# Patient Record
Sex: Male | Born: 1995 | Race: White | Hispanic: Yes | Marital: Single
Health system: Southern US, Community
[De-identification: ages and names within clinical notes are randomized; demographics above are authoritative.]

---

## 2021-07-04 DIAGNOSIS — R002 Palpitations: Secondary | ICD-10-CM | POA: Insufficient documentation

## 2021-07-04 DIAGNOSIS — R0789 Other chest pain: Secondary | ICD-10-CM | POA: Diagnosis present

## 2021-07-05 ENCOUNTER — Encounter (HOSPITAL_COMMUNITY): Payer: Self-pay

## 2021-07-05 ENCOUNTER — Emergency Department (HOSPITAL_COMMUNITY): Payer: No Typology Code available for payment source

## 2021-07-05 ENCOUNTER — Other Ambulatory Visit: Payer: Self-pay

## 2021-07-05 ENCOUNTER — Emergency Department (HOSPITAL_COMMUNITY)
Admission: EM | Admit: 2021-07-05 | Discharge: 2021-07-05 | Disposition: A | Discharge status: Home / Self Care | Payer: No Typology Code available for payment source | Attending: Emergency Medicine | Admitting: Emergency Medicine

## 2021-07-05 DIAGNOSIS — R0789 Other chest pain: Secondary | ICD-10-CM

## 2021-07-05 LAB — BASIC METABOLIC PANEL
Anion gap: 14 (ref 5–15)
BUN: 22 mg/dL — ABNORMAL HIGH (ref 6–20)
CO2: 20 mmol/L — ABNORMAL LOW (ref 22–32)
Calcium: 9.5 mg/dL (ref 8.9–10.3)
Chloride: 106 mmol/L (ref 98–111)
Creatinine, Ser: 1.41 mg/dL — ABNORMAL HIGH (ref 0.61–1.24)
GFR, Estimated: >60 mL/min (ref >60)
Glucose, Bld: 161 mg/dL — ABNORMAL HIGH (ref 70–99)
Potassium: 3.1 mmol/L — ABNORMAL LOW (ref 3.5–5.1)
Sodium: 140 mmol/L (ref 135–145)

## 2021-07-05 LAB — CBC
HCT: 43.4 % (ref 39.0–52.0)
Hemoglobin: 15.7 g/dL (ref 13.0–17.0)
MCH: 32 pg (ref 26.0–34.0)
MCHC: 36.2 g/dL — ABNORMAL HIGH (ref 30.0–36.0)
MCV: 88.4 fL (ref 80.0–100.0)
Platelets: 296 10*3/uL (ref 150–400)
RBC: 4.91 MIL/uL (ref 4.22–5.81)
RDW: 11.8 % (ref 11.5–15.5)
WBC: 6.8 10*3/uL (ref 4.0–10.5)
nRBC: 0 % (ref 0.0–0.2)

## 2021-07-05 LAB — TROPONIN I (HIGH SENSITIVITY)
Troponin I (High Sensitivity): 2 ng/L (ref <18)
Troponin I (High Sensitivity): <2 ng/L (ref <18)

## 2021-07-05 NOTE — ED Notes (Signed)
Save blue tube in main lab °

## 2021-07-05 NOTE — ED Triage Notes (Signed)
Pt reports with chest pain since 1145 pm. Pt reports taking a delta 8 gummy but states that he has been doing them for 2 years.

## 2021-07-05 NOTE — ED Notes (Signed)
EDP at bedside  

## 2021-07-05 NOTE — ED Notes (Signed)
Patient verbalizes understanding of discharge instructions. Opportunity for questioning and answers were provided. Armband removed by staff, pt discharged from ED to home with significant other via POV

## 2021-07-05 NOTE — Discharge Instructions (Signed)
Drink plenty of fluids and get plenty of rest.  Follow-up with primary doctor in the next 1 to 2 weeks, and return to the ER if symptoms significantly worsen or change.

## 2021-07-05 NOTE — ED Provider Notes (Signed)
[facility] DEPT Provider Note   CSN: [id number] Arrival date & time: 07/04/21  2355     History  Chief Complaint  Patient presents with   Chest Pain    Jorge Alvarado is a 26 y.o. male.  Patient is a 26 year old male with no significant past medical history presenting today with complaints of chest discomfort.  This started acutely at approximately 1145 this evening.  Patient was showering at the time.  He felt fine throughout the day until the onset of these symptoms.  Patient tells me that him and his significant other were recently in [place] and just returned home yesterday.  They did travel for an extended period of time, but he denies any leg pain.  Symptoms have since resolved and he feels better.  During my exam, patient did not mention taking a delta 8 gummy as was reported in the nurses note.  The history is provided by the patient.      Home Medications Prior to Admission medications   Not on File      Allergies    Patient has no allergy information on record.    Review of Systems   Review of Systems  Cardiovascular:  Positive for chest pain.  All other systems reviewed and are negative.  Physical Exam Updated Vital Signs BP 133/77   Pulse (!) 116   Resp (!) 21   Ht 5\' 8"  (1.727 m)   Wt 77.1 kg   SpO2 100%   BMI 25.85 kg/m  Physical Exam Vitals and nursing note reviewed.  Constitutional:      General: He is not in acute distress.    Appearance: He is well-developed. He is not diaphoretic.  HENT:     Head: Normocephalic and atraumatic.  Cardiovascular:     Rate and Rhythm: Normal rate and regular rhythm.     Heart sounds: No murmur heard.   No friction rub.  Pulmonary:     Effort: Pulmonary effort is normal. No respiratory distress.     Breath sounds: Normal breath sounds. No wheezing or rales.  Abdominal:     General: Bowel sounds are normal. There is no distension.     Palpations: Abdomen is soft.      Tenderness: There is no abdominal tenderness.  Musculoskeletal:        General: Normal range of motion.     Cervical back: Normal range of motion and neck supple.     Right lower leg: No tenderness. No edema.     Left lower leg: No tenderness. No edema.  Skin:    General: Skin is warm and dry.  Neurological:     Mental Status: He is alert and oriented to person, place, and time.     Coordination: Coordination normal.    ED Results / Procedures / Treatments   Labs (all labs ordered are listed, but only abnormal results are displayed) Labs Reviewed  BASIC METABOLIC PANEL - Abnormal; Notable for the following components:      Result Value   Potassium 3.1 (*)    CO2 20 (*)    Glucose, Bld 161 (*)    BUN 22 (*)    Creatinine, Ser 1.41 (*)    All other components within normal limits  CBC - Abnormal; Notable for the following components:   MCHC 36.2 (*)    All other components within normal limits  TROPONIN I (HIGH SENSITIVITY)  TROPONIN I (HIGH SENSITIVITY)    EKG EKG  Interpretation  Date/Time:  Friday Jul 05 2021 00:49:37 EDT Ventricular Rate:  107 PR Interval:  180 QRS Duration: 97 QT Interval:  336 QTC Calculation: 449 R Axis:   90 Text Interpretation: Sinus tachycardia Borderline right axis deviation Baseline wander in lead(s) V3 Confirmed by Veryl Speak [phone]) on 07/05/2021 12:53:26 AM  Radiology DG Chest Port 1 View  Result Date: 07/05/2021 CLINICAL DATA:  Chest pain EXAM: PORTABLE CHEST 1 VIEW COMPARISON:  None Available. FINDINGS: The heart size and mediastinal contours are within normal limits. Both lungs are clear. The visualized skeletal structures are unremarkable. IMPRESSION: No active disease. Electronically Signed   By: Inez Catalina M.D.   On: 07/05/2021 00:27    Procedures Procedures    Medications Ordered in ED Medications - No data to display  ED Course/ Medical Decision Making/ A&P  Patient presenting here with complaints of chest discomfort  and palpitations.  This started this evening while he was in the shower.  Symptoms lasted approximately 2 hours and seem to have resolved.  His work-up here is unremarkable including EKG, laboratory studies, and troponin x2.  Patient did admit to the nurse that he took a delta 8 gummy earlier in the evening and I suspect that this may have been a side effect of this.  I feel as though patient can safely be discharged.  He has been ambulated in the ED and appears well.  To return as needed for any problems.  Final Clinical Impression(s) / ED Diagnoses Final diagnoses:  None    Rx / DC Orders ED Discharge Orders     None         Veryl Speak, MD 07/05/21 [phone]

## 2021-07-05 NOTE — ED Notes (Signed)
Unable to collect temp at this time 

## 2023-04-24 IMAGING — DX DG CHEST 1V PORT
1 series · 1 of 1 positions shown · non-contrast
Comparison: None Available.

CLINICAL DATA: Chest pain

EXAM:
PORTABLE CHEST 1 VIEW

[chest ap]
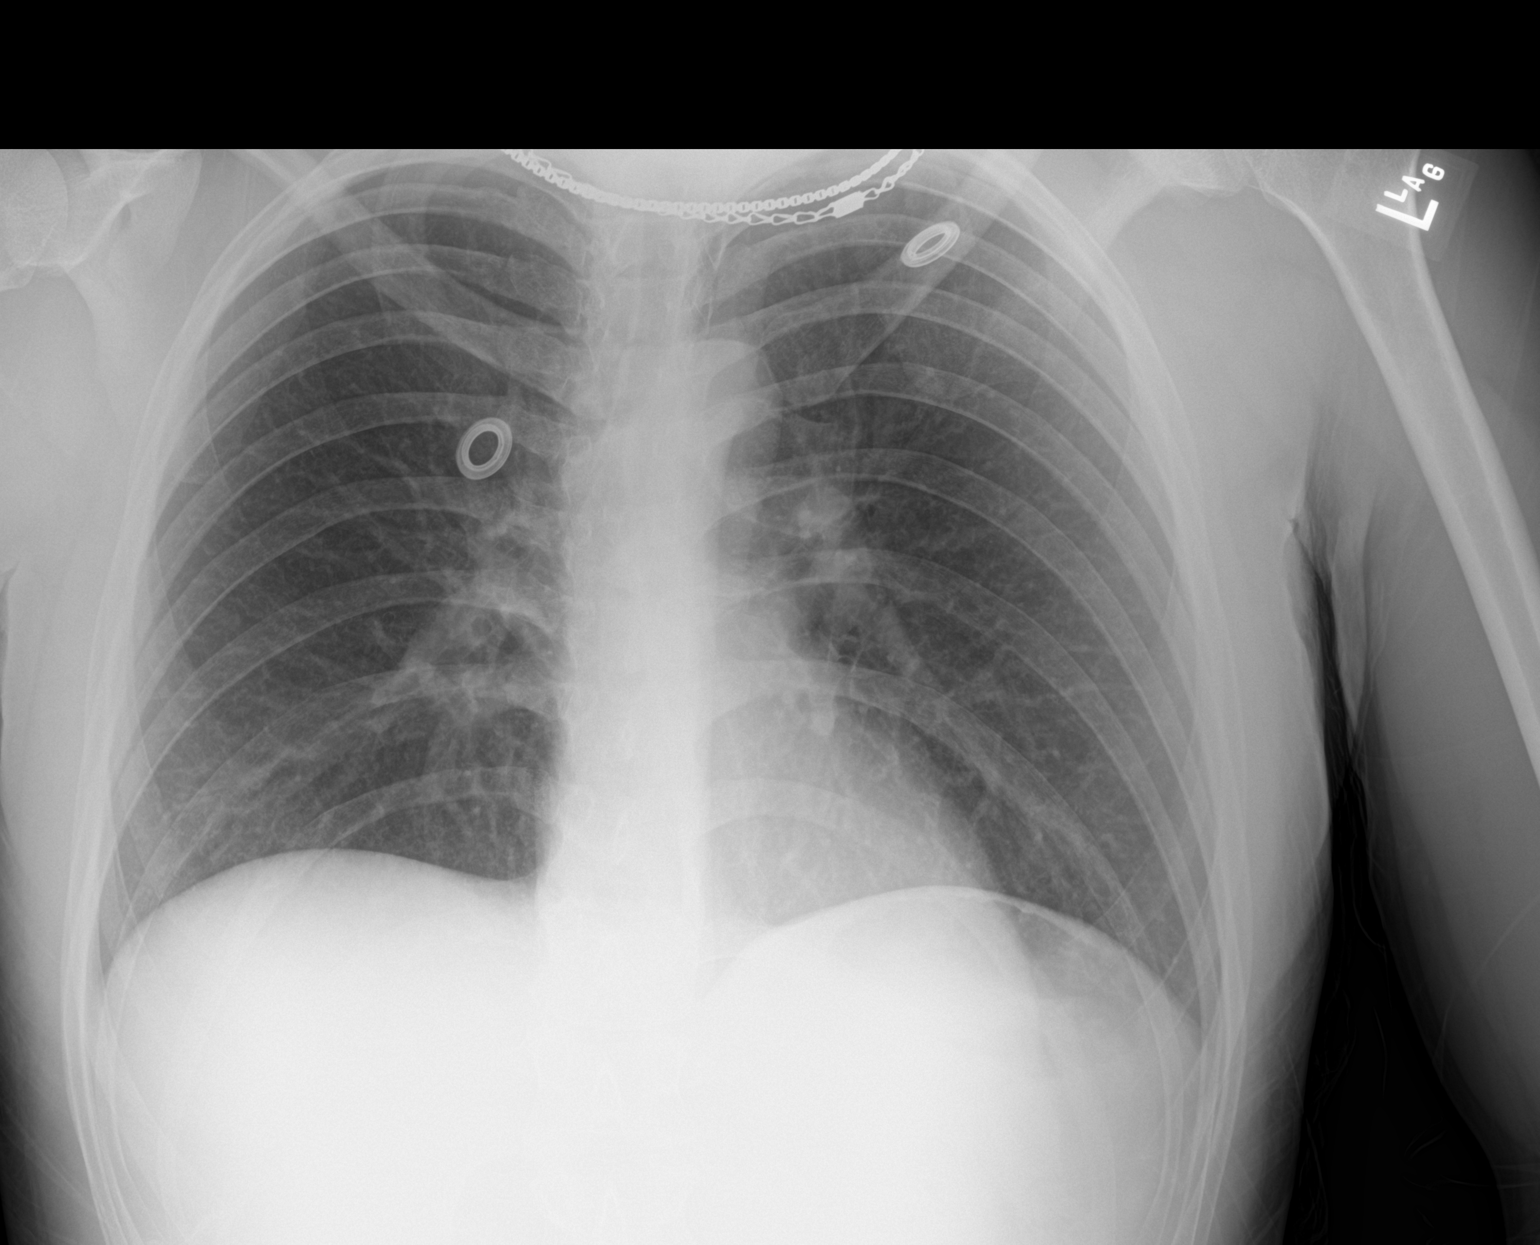

[1 of 1 positions shown; findings below may reference images not displayed]

FINDINGS: The heart size and mediastinal contours are within normal limits.
Both lungs are clear. The visualized skeletal structures are
unremarkable.
IMPRESSION: No active disease.
